# Patient Record
Sex: Male | Born: 1990 | Race: Black or African American | Hispanic: No | Marital: Single | Smoking: Current every day smoker
Health system: Southern US, Community
[De-identification: ages and names within clinical notes are randomized; demographics above are authoritative.]

---
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
## 2023-01-16 ENCOUNTER — Ambulatory Visit (HOSPITAL_COMMUNITY)
Admission: EM | Admit: 2023-01-16 | Discharge: 2023-01-16 | Disposition: A | Discharge status: Home / Self Care | Payer: Commercial Managed Care - PPO | Attending: Internal Medicine | Admitting: Internal Medicine

## 2023-01-16 ENCOUNTER — Encounter (HOSPITAL_COMMUNITY): Payer: Self-pay

## 2023-01-16 DIAGNOSIS — J309 Allergic rhinitis, unspecified: Secondary | ICD-10-CM

## 2023-01-16 MED ORDER — FLUTICASONE PROPIONATE 50 MCG/ACT NA SUSP: 1.0000 | Freq: Every day | NASAL | 0 refills | Status: DC

## 2023-01-16 MED ORDER — CETIRIZINE HCL 10 MG PO TABS: 10.0000 mg | ORAL_TABLET | Freq: Every day | ORAL | 1 refills | Status: DC

## 2023-01-16 NOTE — Discharge Instructions (Signed)
Please take medications as prescribed Your symptoms are consistent with seasonal allergies Your physical exam is reassuring You may return to work tomorrow.

## 2023-01-16 NOTE — ED Provider Notes (Signed)
[facility]    CSN: [id number] Arrival date & time: 01/16/23  8850      History   Chief Complaint No chief complaint on file.   HPI Jimmy Grimes is a 32 y.o. male comes to the urgent care with 4-day history of nasal congestion with itching nostrils.  Patient says symptoms started insidiously and has been persistent.  He denies any nausea or vomiting.  No fever or chills.  No shortness of breath or wheezing.  No sick contacts.  Patient endorses postnasal drainage with nonproductive cough.   HPI  History reviewed. No pertinent past medical history.  There are no problems to display for this patient.   History reviewed. No pertinent surgical history.     Home Medications    Prior to Admission medications   Medication Sig Start Date End Date Taking? Authorizing Provider  cetirizine (ZYRTEC ALLERGY) 10 MG tablet Take 1 tablet (10 mg total) by mouth daily. 01/16/23  Yes Bayyinah Dukeman, Britta Mccreedy, MD  fluticasone (FLONASE) 50 MCG/ACT nasal spray Place 1 spray into both nostrils daily. 01/16/23  Yes Deddrick Saindon, Britta Mccreedy, MD    Family History Family History  Adopted: Yes    Social History Social History   Tobacco Use   Smoking status: Every Day    Packs/day: .35    Types: Cigarettes   Smokeless tobacco: Never  Vaping Use   Vaping Use: Never used  Substance Use Topics   Alcohol use: Never   Drug use: Never     Allergies   Patient has no known allergies.   Review of Systems Review of Systems As per HPI  Physical Exam Triage Vital Signs ED Triage Vitals  Enc Vitals Group     BP 01/16/23 0847 139/86     Pulse Rate 01/16/23 0847 81     Resp 01/16/23 0847 16     Temp 01/16/23 0847 98.5 F (36.9 C)     Temp Source 01/16/23 0847 Oral     SpO2 01/16/23 0847 98 %     Weight --      Height --      Head Circumference --      Peak Flow --      Pain Score 01/16/23 0848 0     Pain Loc --      Pain Edu? --      Excl. in GC? --    No data found.  Updated  Vital Signs BP 139/86 (BP Location: Left Arm)   Pulse 81   Temp 98.5 F (36.9 C) (Oral)   Resp 16   SpO2 98%   Visual Acuity Right Eye Distance:   Left Eye Distance:   Bilateral Distance:    Right Eye Near:   Left Eye Near:    Bilateral Near:     Physical Exam Vitals and nursing note reviewed.  HENT:     Right Ear: Tympanic membrane normal.     Left Ear: Tympanic membrane normal.     Nose: Congestion present.     Mouth/Throat:     Mouth: Mucous membranes are moist.     Pharynx: No posterior oropharyngeal erythema.  Cardiovascular:     Rate and Rhythm: Normal rate and regular rhythm.  Pulmonary:     Effort: Pulmonary effort is normal.     Breath sounds: Normal breath sounds.  Neurological:     Mental Status: He is alert.      UC Treatments / Results  Labs (all labs ordered  are listed, but only abnormal results are displayed) Labs Reviewed - No data to display  EKG   Radiology No results found.  Procedures Procedures (including critical care time)  Medications Ordered in UC Medications - No data to display  Initial Impression / Assessment and Plan / UC Course  I have reviewed the triage vital signs and the nursing notes.  Pertinent labs & imaging results that were available during my care of the patient were reviewed by me and considered in my medical decision making (see chart for details).     1.  Allergic rhinitis with postnasal drainage: Fluticasone nasal spray Zyrtec 10 mg orally daily Maintain adequate hydration Tylenol/Motrin as needed for pain/or fever Return precautions given. Final Clinical Impressions(s) / UC Diagnoses   Final diagnoses:  Allergic rhinitis with postnasal drip     Discharge Instructions      Please take medications as prescribed Your symptoms are consistent with seasonal allergies Your physical exam is reassuring You may return to work tomorrow.   ED Prescriptions     Medication Sig Dispense Auth. Provider    fluticasone (FLONASE) 50 MCG/ACT nasal spray Place 1 spray into both nostrils daily. 16 g Merrilee Jansky, MD   cetirizine (ZYRTEC ALLERGY) 10 MG tablet Take 1 tablet (10 mg total) by mouth daily. 30 tablet Melessia Kaus, Britta Mccreedy, MD      PDMP not reviewed this encounter.   Merrilee Jansky, MD 01/16/23 2245

## 2023-01-16 NOTE — ED Triage Notes (Signed)
Patient reports that he was having nasal congestion and sore throat x 4 days, but much better now and is wanting a work note.

## 2023-02-27 ENCOUNTER — Encounter (HOSPITAL_COMMUNITY): Payer: Self-pay | Admitting: Emergency Medicine

## 2023-02-27 ENCOUNTER — Ambulatory Visit (HOSPITAL_COMMUNITY)
Admission: EM | Admit: 2023-02-27 | Discharge: 2023-02-27 | Disposition: A | Discharge status: Home / Self Care | Payer: Commercial Managed Care - PPO | Attending: Urgent Care | Admitting: Urgent Care

## 2023-02-27 DIAGNOSIS — R0981 Nasal congestion: Secondary | ICD-10-CM

## 2023-02-27 DIAGNOSIS — J302 Other seasonal allergic rhinitis: Secondary | ICD-10-CM

## 2023-02-27 MED ORDER — FLUTICASONE PROPIONATE 50 MCG/ACT NA SUSP: 1.0000 | Freq: Every day | NASAL | 0 refills | Status: DC

## 2023-02-27 MED ORDER — CETIRIZINE HCL 10 MG PO TABS: 10.0000 mg | ORAL_TABLET | Freq: Every day | ORAL | 1 refills | Status: DC

## 2023-02-27 NOTE — Discharge Instructions (Signed)
Please start taking your cetirizine once daily, this will help with nasal congestion and sneezing. Please also use 1 to 2 sprays of the Flonase daily in your nostrils. Drink plenty of water to maintain hydration.  Should your symptoms persist or worsen, please return for further evaluation.

## 2023-02-27 NOTE — ED Triage Notes (Signed)
Pt c/o congestion and some lightheadedness that started on Saturday. Took tylenol.

## 2023-02-27 NOTE — ED Provider Notes (Signed)
[facility]    CSN: [id number] Arrival date & time: 02/27/23  0802      History   Chief Complaint Chief Complaint  Patient presents with   Nasal Congestion    HPI Jimmy Grimes is a 32 y.o. male.   Pleasant 32 year old male presents today due to concerns of nasal congestion, sneezing, postnasal drip starting on Saturday.  He reports on Saturday he woke up and felt lightheaded therefore was unable to go to work.  He states that today the lightheadedness is nearly resolved.  Has had this before with upper respiratory infections.  He was seen for similar symptoms on April 8 in which Zyrtec and Flonase were prescribed.  Patient states he never picked up the medications nor started them.  He is having very similar symptoms today.  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  Patient denies sinus pain, fever, sore throat, cough, wheezing, shortness of breath, or any additional symptoms.     History reviewed. No pertinent past medical history.  There are no problems to display for this patient.   History reviewed. No pertinent surgical history.     Home Medications    Prior to Admission medications   Medication Sig Start Date End Date Taking? Authorizing Provider  cetirizine (ZYRTEC ALLERGY) 10 MG tablet Take 1 tablet (10 mg total) by mouth daily. 02/27/23   Damontay Alred L, PA  fluticasone (FLONASE) 50 MCG/ACT nasal spray Place 1 spray into both nostrils daily. 02/27/23   Maretta Bees, PA    Family History Family History  Adopted: Yes    Social History Social History   Tobacco Use   Smoking status: Every Day    Packs/day: .35    Types: Cigarettes   Smokeless tobacco: Never  Vaping Use   Vaping Use: Never used  Substance Use Topics   Alcohol use: Never   Drug use: Never     Allergies   Patient has no known allergies.   Review of Systems Review of Systems As per HPI  Physical Exam Triage  Vital Signs ED Triage Vitals [02/27/23 0817]  Enc Vitals Group     BP 127/77     Pulse Rate 78     Resp 14     Temp 98.4 F (36.9 C)     Temp Source Oral     SpO2 97 %     Weight      Height      Head Circumference      Peak Flow      Pain Score 0     Pain Loc      Pain Edu?      Excl. in GC?    No data found.  Updated Vital Signs BP 127/77 (BP Location: Right Arm)   Pulse 78   Temp 98.4 F (36.9 C) (Oral)   Resp 14   SpO2 97%   Visual Acuity Right Eye Distance:   Left Eye Distance:   Bilateral Distance:    Right Eye Near:   Left Eye Near:    Bilateral Near:     Physical Exam Vitals and nursing note reviewed.  Constitutional:      General: He is not in acute distress.    Appearance: Normal appearance. He is well-developed and normal weight. He is not ill-appearing, toxic-appearing or diaphoretic.  HENT:     Head: Normocephalic and atraumatic.     Right Ear: Tympanic membrane, ear  canal and external ear normal. There is no impacted cerumen.     Left Ear: Tympanic membrane, ear canal and external ear normal. There is no impacted cerumen.     Nose: Congestion and rhinorrhea present.     Mouth/Throat:     Mouth: Mucous membranes are moist.     Pharynx: Oropharynx is clear. No oropharyngeal exudate or posterior oropharyngeal erythema.  Eyes:     General: No scleral icterus.       Right eye: No discharge.        Left eye: No discharge.     Pupils: Pupils are equal, round, and reactive to light.  Cardiovascular:     Rate and Rhythm: Normal rate and regular rhythm.     Pulses: Normal pulses.     Heart sounds: Normal heart sounds. No murmur heard.    No friction rub.  Pulmonary:     Effort: Pulmonary effort is normal. No respiratory distress.     Breath sounds: Normal breath sounds. No stridor. No wheezing, rhonchi or rales.  Chest:     Chest wall: No tenderness.  Abdominal:     Palpations: Abdomen is soft.     Tenderness: There is no abdominal tenderness.   Musculoskeletal:     Cervical back: Normal range of motion and neck supple. No rigidity or tenderness.  Lymphadenopathy:     Cervical: No cervical adenopathy.  Skin:    General: Skin is warm and dry.     Capillary Refill: Capillary refill takes less than 2 seconds.     Coloration: Skin is not jaundiced or pale.     Findings: No bruising, erythema or rash.  Neurological:     Mental Status: He is alert.  Psychiatric:        Mood and Affect: Mood normal.      UC Treatments / Results  Labs (all labs ordered are listed, but only abnormal results are displayed) Labs Reviewed - No data to display  EKG   Radiology No results found.  Procedures Procedures (including critical care time)  Medications Ordered in UC Medications - No data to display  Initial Impression / Assessment and Plan / UC Course  I have reviewed the triage vital signs and the nursing notes.  Pertinent labs & imaging results that were available during my care of the patient were reviewed by me and considered in my medical decision making (see chart for details).     Nasal congestion -patient with similar symptoms 1 month ago.  Never filled the Zyrtec or Flonase that was previously prescribed.  I will refill them today and encourage compliance to help with the symptoms. Seasonal allergic rhinitis -likely the cause of his symptoms.  Treatment as above.   Final Clinical Impressions(s) / UC Diagnoses   Final diagnoses:  Nasal congestion  Seasonal allergic rhinitis, unspecified trigger     Discharge Instructions      Please start taking your cetirizine once daily, this will help with nasal congestion and sneezing. Please also use 1 to 2 sprays of the Flonase daily in your nostrils. Drink plenty of water to maintain hydration.  Should your symptoms persist or worsen, please return for further evaluation.     ED Prescriptions     Medication Sig Dispense Auth. Provider   cetirizine (ZYRTEC  ALLERGY) 10 MG tablet Take 1 tablet (10 mg total) by mouth daily. 30 tablet Moira Umholtz L, PA   fluticasone (FLONASE) 50 MCG/ACT nasal spray Place 1 spray into both  nostrils daily. 16 g Georgina Krist L, [place]      PDMP not reviewed this encounter.   Maretta Bees, [place] 02/27/23 1502

## 2023-05-16 ENCOUNTER — Ambulatory Visit (HOSPITAL_COMMUNITY)
Admission: EM | Admit: 2023-05-16 | Discharge: 2023-05-16 | Disposition: A | Discharge status: Home / Self Care | Payer: Commercial Managed Care - PPO

## 2023-05-16 ENCOUNTER — Encounter (HOSPITAL_COMMUNITY): Payer: Self-pay

## 2023-05-16 DIAGNOSIS — B349 Viral infection, unspecified: Secondary | ICD-10-CM

## 2023-05-16 NOTE — Discharge Instructions (Addendum)
You have a viral upper respiratory infection.  Symptoms should improve over the next week to 10 days.  If you develop chest pain or shortness of breath, go to the emergency room.  Some things that can make you feel better are: - Increased rest - Increasing fluid with water/sugar free electrolytes - Acetaminophen and ibuprofen as needed for fever/pain - Salt water gargling, chloraseptic spray and throat lozenges - OTC guaifenesin (Mucinex) 600 mg twice daily for congestion - Saline sinus flushes or a neti pot - Humidifying the air

## 2023-05-16 NOTE — ED Provider Notes (Signed)
[facility]    CSN: [id number] Arrival date & time: 05/16/23  1231      History   Chief Complaint Chief Complaint  Patient presents with   Fatigue   Diarrhea   Chills    HPI Jimmy Grimes is a 32 y.o. male.   Patient presents today for 1 day history of sweating and cool chills, runny and stuffy nose, diarrhea, fatigue, and lightheadedness.  Reports his symptoms are most of the way better and he repeatedly asks me for a work note.  No fever, body aches, cough, chest pain, shortness of breath, sore throat, headache, ear pain, abdominal pain, nausea/vomiting, decreased appetite, loss of taste/smell.  Reports his symptoms have improved since the day goes on.  Has been exposed to multiple sick contacts at work.  Has taken Tylenol for symptoms with improvement.      History reviewed. No pertinent past medical history.  There are no problems to display for this patient.   History reviewed. No pertinent surgical history.     Home Medications    Prior to Admission medications   Medication Sig Start Date End Date Taking? Authorizing Provider  cetirizine (ZYRTEC ALLERGY) 10 MG tablet Take 1 tablet (10 mg total) by mouth daily. 02/27/23   Crain, Whitney L, PA  fluticasone (FLONASE) 50 MCG/ACT nasal spray Place 1 spray into both nostrils daily. 02/27/23   Maretta Bees, PA    Family History Family History  Adopted: Yes    Social History Social History   Tobacco Use   Smoking status: Every Day    Current packs/day: 0.35    Types: Cigarettes   Smokeless tobacco: Never  Vaping Use   Vaping status: Never Used  Substance Use Topics   Alcohol use: Never   Drug use: Never     Allergies   Patient has no known allergies.   Review of Systems Review of Systems Per HPI  Physical Exam Triage Vital Signs ED Triage Vitals [05/16/23 1420]  Encounter Vitals Group     BP 133/78     Systolic BP Percentile      Diastolic BP Percentile      Pulse Rate 73      Resp 18     Temp 98 F (36.7 C)     Temp Source Oral     SpO2 98 %     Weight      Height      Head Circumference      Peak Flow      Pain Score      Pain Loc      Pain Education      Exclude from Growth Chart    No data found.  Updated Vital Signs BP 133/78 (BP Location: Left Arm)   Pulse 73   Temp 98 F (36.7 C) (Oral)   Resp 18   SpO2 98%   Visual Acuity Right Eye Distance:   Left Eye Distance:   Bilateral Distance:    Right Eye Near:   Left Eye Near:    Bilateral Near:     Physical Exam Vitals and nursing note reviewed.  Constitutional:      General: He is not in acute distress.    Appearance: Normal appearance. He is not ill-appearing or toxic-appearing.  HENT:     Head: Normocephalic and atraumatic.     Right Ear: Tympanic membrane, ear canal and external ear normal.     Left Ear: Tympanic membrane, ear canal  and external ear normal.     Nose: No congestion or rhinorrhea.     Mouth/Throat:     Mouth: Mucous membranes are moist.     Pharynx: Oropharynx is clear. No oropharyngeal exudate or posterior oropharyngeal erythema.  Eyes:     General: No scleral icterus.    Extraocular Movements: Extraocular movements intact.  Cardiovascular:     Rate and Rhythm: Normal rate and regular rhythm.  Pulmonary:     Effort: Pulmonary effort is normal. No respiratory distress.     Breath sounds: Normal breath sounds. No wheezing, rhonchi or rales.  Abdominal:     General: Abdomen is flat. Bowel sounds are normal. There is no distension.     Palpations: Abdomen is soft.     Tenderness: There is no abdominal tenderness. There is no right CVA tenderness, left CVA tenderness, guarding or rebound.  Musculoskeletal:     Cervical back: Normal range of motion and neck supple.  Lymphadenopathy:     Cervical: No cervical adenopathy.  Skin:    General: Skin is warm and dry.     Coloration: Skin is not jaundiced or pale.     Findings: No erythema or rash.  Neurological:      Mental Status: He is alert and oriented to person, place, and time.  Psychiatric:        Behavior: Behavior is cooperative.      UC Treatments / Results  Labs (all labs ordered are listed, but only abnormal results are displayed) Labs Reviewed - No data to display  EKG   Radiology No results found.  Procedures Procedures (including critical care time)  Medications Ordered in UC Medications - No data to display  Initial Impression / Assessment and Plan / UC Course  I have reviewed the triage vital signs and the nursing notes.  Pertinent labs & imaging results that were available during my care of the patient were reviewed by me and considered in my medical decision making (see chart for details).   Patient is well-appearing, normotensive, afebrile, not tachycardic, not tachypneic, oxygenating well on room air.    1. Viral illness Suspect viral illness based on symptoms Vitals and exam today reassuring Patient declines COVID-19 testing Supportive care discussed ER and return precautions discussed Work excuse given  The patient was given the opportunity to ask questions.  All questions answered to their satisfaction.  The patient is in agreement to this plan.    Final Clinical Impressions(s) / UC Diagnoses   Final diagnoses:  Viral illness     Discharge Instructions      You have a viral upper respiratory infection.  Symptoms should improve over the next week to 10 days.  If you develop chest pain or shortness of breath, go to the emergency room.  Some things that can make you feel better are: - Increased rest - Increasing fluid with water/sugar free electrolytes - Acetaminophen and ibuprofen as needed for fever/pain - Salt water gargling, chloraseptic spray and throat lozenges - OTC guaifenesin (Mucinex) 600 mg twice daily for congestion - Saline sinus flushes or a neti pot - Humidifying the air     ED Prescriptions   None    PDMP not reviewed this  encounter.   Valentino Nose, NP 05/16/23 1446

## 2023-05-16 NOTE — ED Triage Notes (Addendum)
Pt presents to the office for diarrhea,fatigue and chills that started yesterday. Pt has not taken any medication for his symptoms. Pt will need a work excuse note.

## 2023-10-26 ENCOUNTER — Encounter (HOSPITAL_COMMUNITY): Payer: Self-pay

## 2023-10-26 ENCOUNTER — Ambulatory Visit (HOSPITAL_COMMUNITY)
Admission: EM | Admit: 2023-10-26 | Discharge: 2023-10-26 | Disposition: A | Discharge status: Home / Self Care | Payer: Commercial Managed Care - PPO | Attending: Family Medicine | Admitting: Family Medicine

## 2023-10-26 DIAGNOSIS — J111 Influenza due to unidentified influenza virus with other respiratory manifestations: Secondary | ICD-10-CM

## 2023-10-26 LAB — POC COVID19/FLU A&B COMBO
Covid Antigen, POC: NEGATIVE
Influenza A Antigen, POC: POSITIVE — AB
Influenza B Antigen, POC: NEGATIVE

## 2023-10-26 MED ORDER — ACETAMINOPHEN 325 MG PO TABS
ORAL_TABLET | ORAL | Status: AC
  Filled 2023-10-26: qty 2

## 2023-10-26 MED ORDER — ACETAMINOPHEN 325 MG PO TABS
650.0000 mg | ORAL_TABLET | Freq: Once | ORAL | Status: AC
  Administered 2023-10-26: 650 mg via ORAL

## 2023-10-26 MED ORDER — OSELTAMIVIR PHOSPHATE 75 MG PO CAPS: 75.0000 mg | ORAL_CAPSULE | Freq: Two times a day (BID) | ORAL | 0 refills | Status: AC

## 2023-10-26 NOTE — ED Triage Notes (Signed)
Pt c/o cough, congestion, fever, body aches, and sweating since 7pm last night. States last ibuprofen was this am.

## 2023-10-26 NOTE — ED Provider Notes (Signed)
[facility]    CSN: [id number] Arrival date & time: 10/26/23  1407      History   Chief Complaint Chief Complaint  Patient presents with   Cough   Fever    HPI Jimmy Grimes is a 33 y.o. male.    Cough Associated symptoms: fever, myalgias, rhinorrhea and sore throat   Fever Associated symptoms: congestion, cough, myalgias, rhinorrhea and sore throat    Patient is here for URI symptoms since last evening.  He started to feel poorly with body aches, chills, fatigue.  Having runny nose, congestion, and drainage.  Slight cough.  No wheezing or sob.  Unaware of how high is fever was until here today.  He just returned from [place].  No known sick contacts.       History reviewed. No pertinent past medical history.  There are no active problems to display for this patient.   History reviewed. No pertinent surgical history.     Home Medications    Prior to Admission medications   Not on File    Family History Family History  Adopted: Yes    Social History Social History   Tobacco Use   Smoking status: Every Day    Current packs/day: 0.35    Types: Cigarettes   Smokeless tobacco: Never  Vaping Use   Vaping status: Never Used  Substance Use Topics   Alcohol use: Never   Drug use: Never     Allergies   Patient has no known allergies.   Review of Systems Review of Systems  Constitutional:  Positive for fever.  HENT:  Positive for congestion, rhinorrhea and sore throat.   Respiratory:  Positive for cough.   Gastrointestinal: Negative.   Genitourinary: Negative.   Musculoskeletal:  Positive for myalgias.     Physical Exam Triage Vital Signs ED Triage Vitals [10/26/23 1541]  Encounter Vitals Group     BP 126/69     Systolic BP Percentile      Diastolic BP Percentile      Pulse Rate (!) 111     Resp 18     Temp (!) 102.9 F (39.4 C)     Temp Source Oral     SpO2 94 %     Weight      Height      Head Circumference       Peak Flow      Pain Score 7     Pain Loc      Pain Education      Exclude from Growth Chart    No data found.  Updated Vital Signs BP 126/69 (BP Location: Left Arm)   Pulse (!) 111   Temp (!) 102.9 F (39.4 C) (Oral)   Resp 18   SpO2 94%   Visual Acuity Right Eye Distance:   Left Eye Distance:   Bilateral Distance:    Right Eye Near:   Left Eye Near:    Bilateral Near:     Physical Exam Constitutional:      General: He is not in acute distress.    Appearance: Normal appearance. He is normal weight. He is not ill-appearing or toxic-appearing.  HENT:     Nose: Congestion and rhinorrhea present.     Mouth/Throat:     Mouth: Mucous membranes are moist.  Cardiovascular:     Rate and Rhythm: Normal rate and regular rhythm.  Pulmonary:     Effort: Pulmonary effort is normal.  Breath sounds: Normal breath sounds. No wheezing.  Musculoskeletal:     Cervical back: Normal range of motion and neck supple. No tenderness.  Lymphadenopathy:     Cervical: No cervical adenopathy.  Skin:    General: Skin is warm and dry.  Neurological:     General: No focal deficit present.     Mental Status: He is alert and oriented to person, place, and time.  Psychiatric:        Mood and Affect: Mood normal.        Behavior: Behavior normal.      UC Treatments / Results  Labs (all labs ordered are listed, but only abnormal results are displayed) Labs Reviewed  POC COVID19/FLU A&B COMBO - Abnormal; Notable for the following components:      Result Value   Influenza A Antigen, POC Positive (*)    All other components within normal limits    EKG   Radiology No results found.  Procedures Procedures (including critical care time)  Medications Ordered in UC Medications  acetaminophen (TYLENOL) tablet 650 mg (650 mg Oral Given 10/26/23 1545)    Initial Impression / Assessment and Plan / UC Course  I have reviewed the triage vital signs and the nursing notes.  Pertinent  labs & imaging results that were available during my care of the patient were reviewed by me and considered in my medical decision making (see chart for details).    Final Clinical Impressions(s) / UC Diagnoses   Final diagnoses:  Influenza with respiratory manifestation     Discharge Instructions      You were diagnosed with influenza A today.  I have sent out tamiflu to treat this.  Please start today to help reduce symptoms severity.  You can take over the counter tylenol/motrin as well for symptoms.  Get plenty of rest and stay hydrated.  Please return if not improving or worsening.     ED Prescriptions     Medication Sig Dispense Auth. Provider   oseltamivir (TAMIFLU) 75 MG capsule Take 1 capsule (75 mg total) by mouth every 12 (twelve) hours. 10 capsule Jannifer Franklin, MD      PDMP not reviewed this encounter.   Jannifer Franklin, MD 10/26/23 [phone]

## 2023-10-26 NOTE — Discharge Instructions (Addendum)
You were diagnosed with influenza A today.  I have sent out tamiflu to treat this.  Please start today to help reduce symptoms severity.  You can take over the counter tylenol/motrin as well for symptoms.  Get plenty of rest and stay hydrated.  Please return if not improving or worsening.
